# Patient Record
Sex: Male | Born: 1985 | Race: Black or African American | Hispanic: No | Marital: Single | Smoking: Current every day smoker
Health system: Southern US, Community
[De-identification: ages and names within clinical notes are randomized; demographics above are authoritative.]

## PROBLEM LIST (undated history)

## (undated) DIAGNOSIS — A749 Chlamydial infection, unspecified: Secondary | ICD-10-CM

## (undated) DIAGNOSIS — A549 Gonococcal infection, unspecified: Secondary | ICD-10-CM

## (undated) HISTORY — PX: HERNIA REPAIR: SHX51

## (undated) HISTORY — DX: Gonococcal infection, unspecified: A54.9

## (undated) HISTORY — DX: Chlamydial infection, unspecified: A74.9

---

## 2004-02-02 ENCOUNTER — Emergency Department: Payer: Self-pay | Admitting: Emergency Medicine

## 2006-09-24 ENCOUNTER — Emergency Department: Payer: Self-pay | Admitting: Emergency Medicine

## 2008-03-30 IMAGING — CR DG FOOT COMPLETE 3+V*L*
1 series · 3 of 3 positions shown · non-contrast
Comparison: none

REASON FOR EXAM: dropped weight on great toe
COMMENTS:

PROCEDURE:     DXR - DXR FOOT LT COMP W/OBLIQUES  - September 24, 2006  [DATE]
RESULT:     Three views of the LEFT foot show no fracture, dislocation or
other acute bony abnormality.

[Series 1: view not recorded · 0.17mm/px · 3 of 3 slices shown]
[im 1/3]
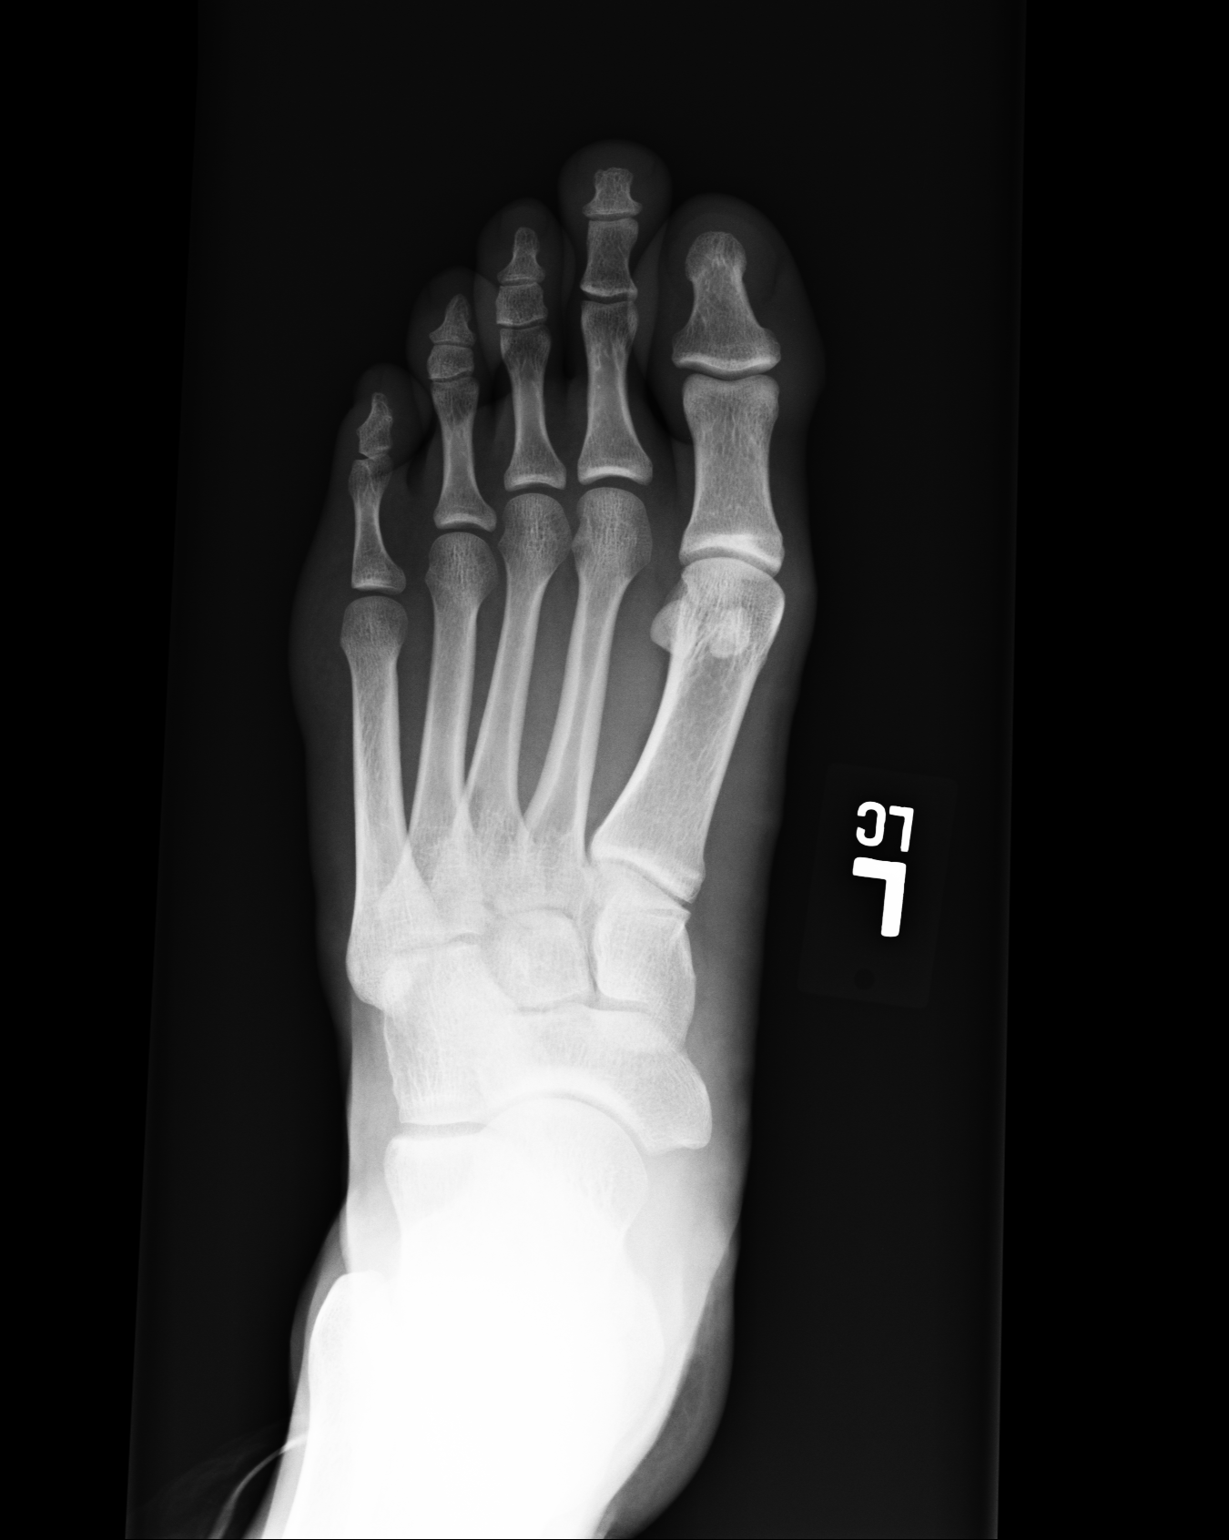
[im 2/3]
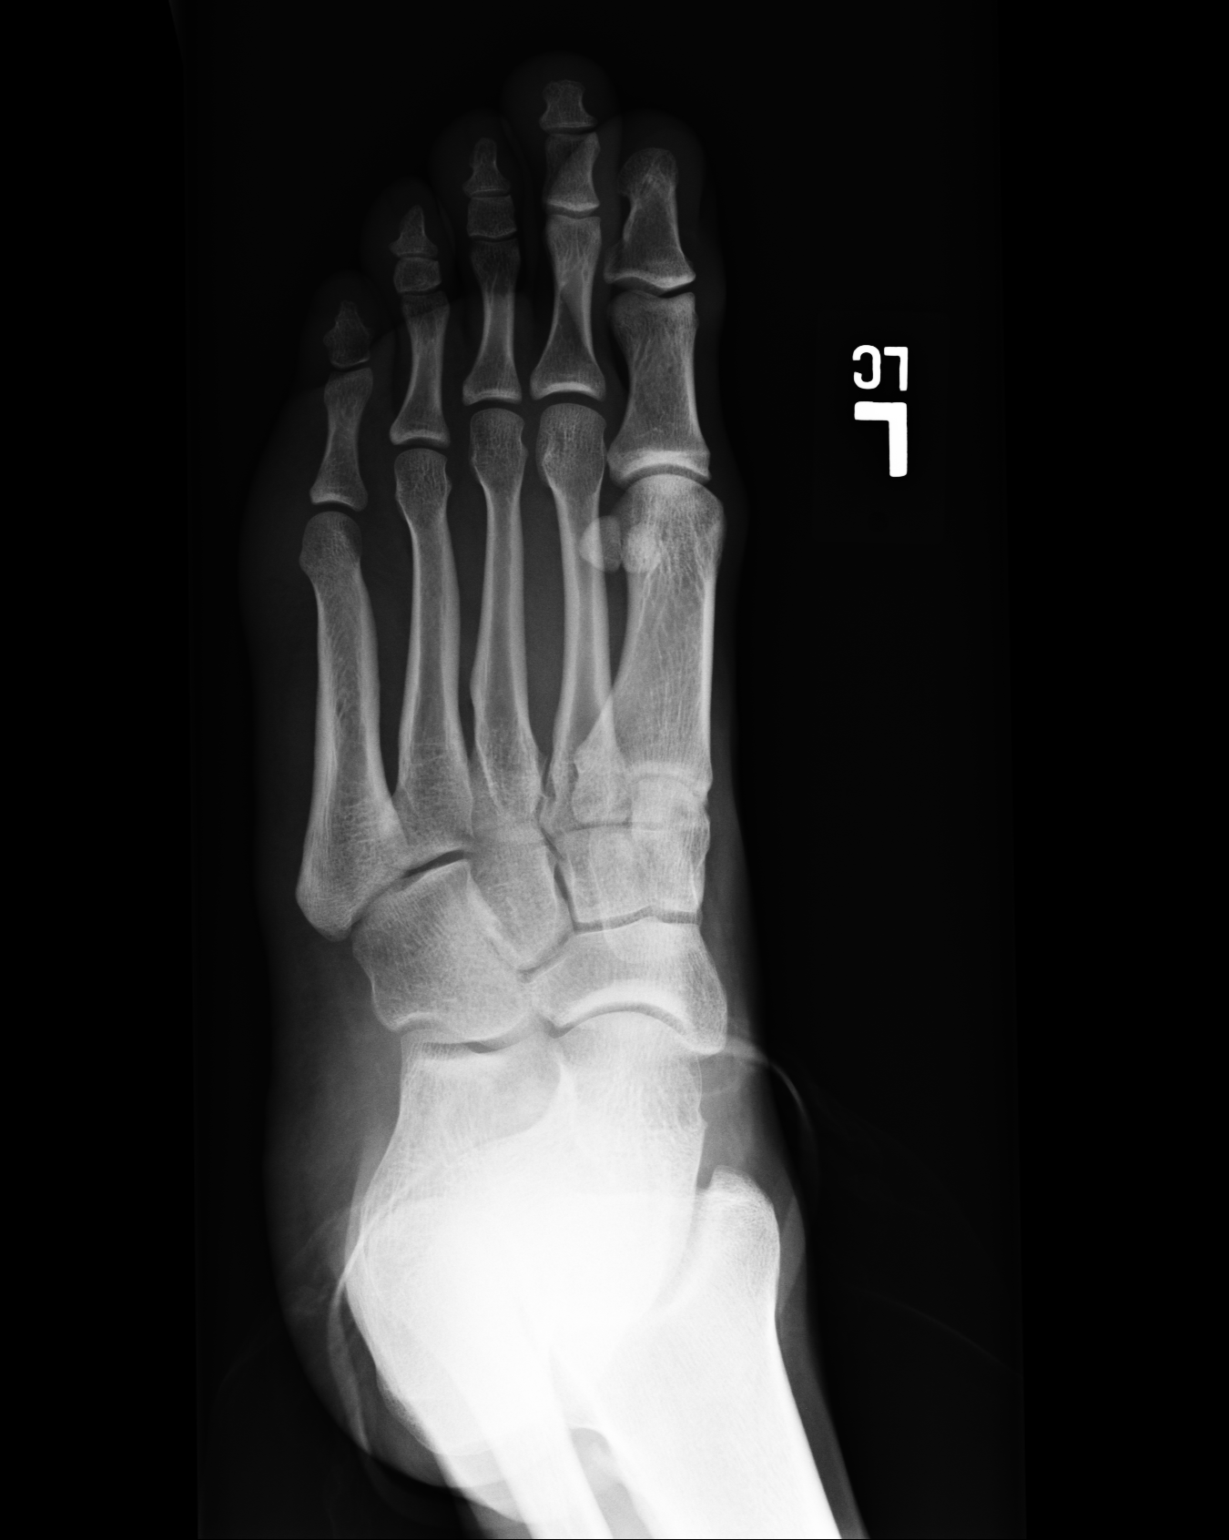
[im 3/3]
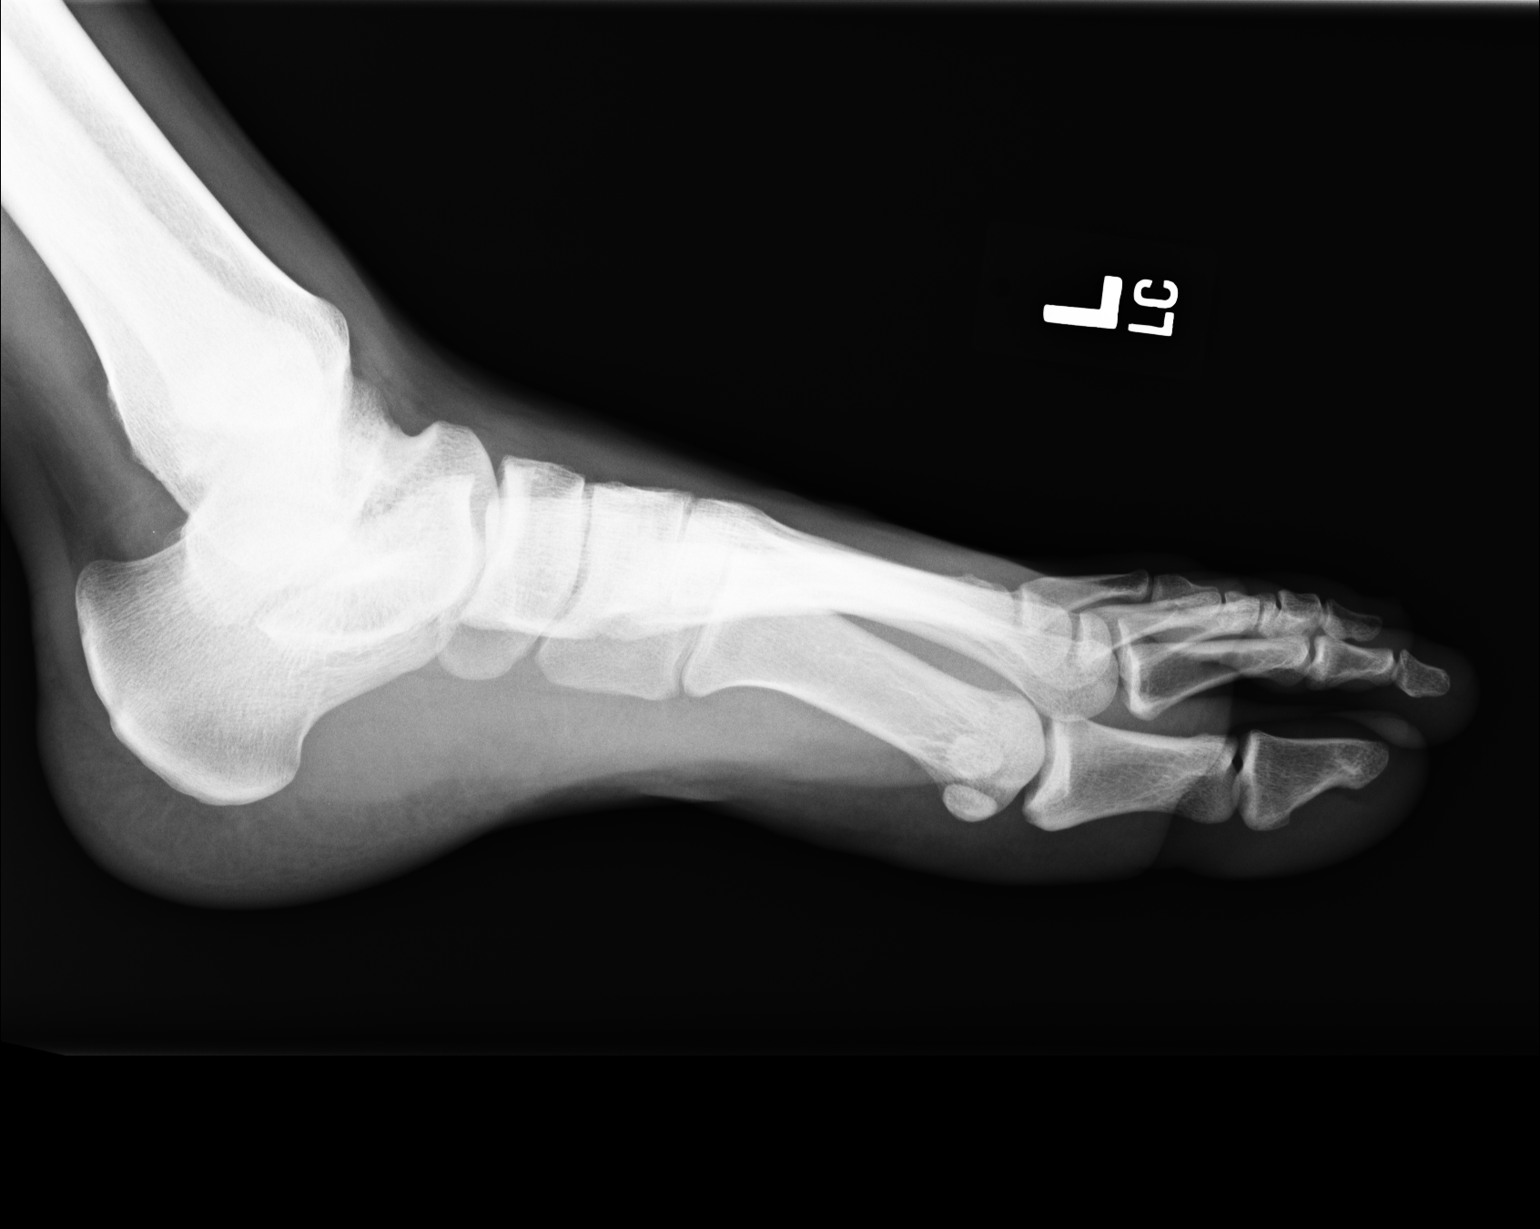

[3 of 3 positions shown; findings below may reference images not displayed]

IMPRESSION: 1)No acute changes are identified.

## 2009-05-18 ENCOUNTER — Emergency Department: Payer: Self-pay | Admitting: Emergency Medicine

## 2010-06-12 ENCOUNTER — Emergency Department: Payer: Self-pay | Admitting: Emergency Medicine

## 2011-05-31 ENCOUNTER — Emergency Department: Payer: Self-pay | Admitting: Emergency Medicine

## 2011-11-07 ENCOUNTER — Emergency Department: Payer: Self-pay | Admitting: *Deleted

## 2014-03-08 ENCOUNTER — Emergency Department: Payer: Self-pay | Admitting: Emergency Medicine

## 2014-03-08 LAB — GC/CHLAMYDIA PROBE AMP

## 2014-11-25 ENCOUNTER — Emergency Department
Admission: EM | Admit: 2014-11-25 | Discharge: 2014-11-25 | Disposition: A | Discharge status: Home / Self Care | Payer: Self-pay | Attending: Emergency Medicine | Admitting: Emergency Medicine

## 2014-11-25 ENCOUNTER — Encounter: Payer: Self-pay | Admitting: Emergency Medicine

## 2014-11-25 DIAGNOSIS — Z202 Contact with and (suspected) exposure to infections with a predominantly sexual mode of transmission: Secondary | ICD-10-CM | POA: Insufficient documentation

## 2014-11-25 DIAGNOSIS — Z720 Tobacco use: Secondary | ICD-10-CM | POA: Insufficient documentation

## 2014-11-25 LAB — CHLAMYDIA/NGC RT PCR (ARMC ONLY)
Chlamydia Tr: DETECTED — AB
N GONORRHOEAE: NOT DETECTED

## 2014-11-25 LAB — URINALYSIS COMPLETE WITH MICROSCOPIC (ARMC ONLY)
Bacteria, UA: NONE SEEN
Bilirubin Urine: NEGATIVE
GLUCOSE, UA: NEGATIVE mg/dL
Hgb urine dipstick: NEGATIVE
KETONES UR: NEGATIVE mg/dL
Nitrite: NEGATIVE
PROTEIN: NEGATIVE mg/dL
Specific Gravity, Urine: 1.017 (ref 1.005–1.030)
Squamous Epithelial / LPF: NONE SEEN
pH: 5 (ref 5.0–8.0)

## 2014-11-25 MED ORDER — AZITHROMYCIN 250 MG PO TABS
1000.0000 mg | ORAL_TABLET | Freq: Once | ORAL | Status: AC
  Administered 2014-11-25: 1000 mg via ORAL
  Filled 2014-11-25: qty 4

## 2014-11-25 NOTE — ED Notes (Signed)
States girlfrfiend was told she had chlamydia

## 2014-11-25 NOTE — ED Provider Notes (Signed)
[facility] Emergency Department Provider Note  ____________________________________________  Time seen: Approximately 4:32 PM  I have reviewed the triage vital signs and the nursing notes.   HISTORY  Chief Complaint Urinary Frequency    HPI Matthew Murillo is a 29 y.o. male patient stated positive contact with Chlamydia . Last sexual contact with one week ago. Patient stated except for some increased frequency no other signs and symptoms of sexual transmitted disease.. Patient also denies any penial lesions.   History reviewed. No pertinent past medical history.  There are no active problems to display for this patient.   Past Surgical History  Procedure Laterality Date  . Hernia repair      No current outpatient prescriptions on file.  Allergies Review of patient's allergies indicates no known allergies.  No family history on file.  Social History Social History  Substance Use Topics  . Smoking status: Current Some Day Smoker    Types: Cigarettes  . Smokeless tobacco: None  . Alcohol Use: Yes    Review of Systems Constitutional: No fever/chills Eyes: No visual changes. ENT: No sore throat. Cardiovascular: Denies chest pain. Respiratory: Denies shortness of breath. Gastrointestinal: No abdominal pain.  No nausea, no vomiting.  No diarrhea.  No constipation. Genitourinary: Negative for dysuria. Increased frequency. Musculoskeletal: Negative for back pain. Skin: Negative for rash. Neurological: Negative for headaches, focal weakness or numbness. Allergic/Immunilogical: **} 10-point ROS otherwise negative.  ____________________________________________   PHYSICAL EXAM:  VITAL SIGNS: ED Triage Vitals  Enc Vitals Group     BP 11/25/14 1625 119/80 mmHg     Pulse Rate 11/25/14 1625 65     Resp 11/25/14 1625 18     Temp 11/25/14 1625 98.1 F (36.7 C)     Temp Source 11/25/14 1625 Oral     SpO2 11/25/14 1625 97 %     Weight  11/25/14 1625 170 lb (77.111 kg)     Height 11/25/14 1625  (1.803 m)     Head Cir --      Peak Flow --      Pain Score --      Pain Loc --      Pain Edu? --      Excl. in GC? --     Constitutional: Alert and oriented. Well appearing and in no acute distress. Eyes: Conjunctivae are normal. PERRL. EOMI. Head: Atraumatic. Nose: No congestion/rhinnorhea. Mouth/Throat: Mucous membranes are moist.  Oropharynx non-erythematous. Neck: No stridor. No cervical spine tenderness to palpation. Hematological/Lymphatic/Immunilogical: No cervical lymphadenopathy. Cardiovascular: Normal rate, regular rhythm. Grossly normal heart sounds.  Good peripheral circulation. Respiratory: Normal respiratory effort.  No retractions. Lungs CTAB. Gastrointestinal: Soft and nontender. No distention. No abdominal bruits. No CVA tenderness. Genitourinary:  Musculoskeletal: No lower extremity tenderness nor edema.  No joint effusions. Neurologic:  Normal speech and language. No gross focal neurologic deficits are appreciated. No gait instability. Skin:  Skin is warm, dry and intact. No rash noted. Psychiatric: Mood and affect are normal. Speech and behavior are normal.  ____________________________________________   LABS (all labs ordered are listed, but only abnormal results are displayed)  Labs Reviewed  URINALYSIS COMPLETEWITH MICROSCOPIC ([facility] ONLY) - Abnormal; Notable for the following:    Color, Urine YELLOW (*)    APPearance CLEAR (*)    Leukocytes, UA 1+ (*)    All other components within normal limits  CHLAMYDIA/NGC RT PCR ([facility] ONLY)   ____________________________________________  EKG   ____________________________________________  RADIOLOGY   ____________________________________________  PROCEDURES  Procedure(s) performed: None  Critical Care performed: No  ____________________________________________   INITIAL IMPRESSION / ASSESSMENT AND PLAN / ED  COURSE  Pertinent labs & imaging results that were available during my care of the patient were reviewed by me and considered in my medical decision making (see chart for details).  Chlamydia exposure. Patient given 1 g of Zithromax and advised to follow with the [facility] department for further testing. ____________________________________________   FINAL CLINICAL IMPRESSION(S) / ED DIAGNOSES  Final diagnoses:  STD exposure      Joni Reining, PA-C 11/25/14 1729  Minna Antis, MD 11/26/14 [phone]

## 2014-11-27 ENCOUNTER — Telehealth: Payer: Self-pay | Admitting: Emergency Medicine

## 2014-11-27 NOTE — ED Notes (Signed)
Called patient to informof positive chlamydia test and need for partner treatment.  Patient was treated while in the ED.  No answer and no voicemail available.  Will send letter if he does not call back.

## 2022-05-19 ENCOUNTER — Ambulatory Visit: Payer: BC Managed Care – PPO | Admitting: Family

## 2022-05-20 ENCOUNTER — Ambulatory Visit: Payer: BC Managed Care – PPO | Admitting: Family

## 2022-05-20 ENCOUNTER — Encounter: Payer: Self-pay | Admitting: Family

## 2022-05-20 VITALS — BP 120/72 | HR 66 | Ht 71.0 in | Wt 172.2 lb

## 2022-05-20 DIAGNOSIS — E538 Deficiency of other specified B group vitamins: Secondary | ICD-10-CM | POA: Diagnosis not present

## 2022-05-20 DIAGNOSIS — I10 Essential (primary) hypertension: Secondary | ICD-10-CM | POA: Diagnosis not present

## 2022-05-20 DIAGNOSIS — R7303 Prediabetes: Secondary | ICD-10-CM

## 2022-05-20 DIAGNOSIS — F17200 Nicotine dependence, unspecified, uncomplicated: Secondary | ICD-10-CM | POA: Insufficient documentation

## 2022-05-20 DIAGNOSIS — R5383 Other fatigue: Secondary | ICD-10-CM

## 2022-05-20 DIAGNOSIS — E782 Mixed hyperlipidemia: Secondary | ICD-10-CM | POA: Diagnosis not present

## 2022-05-20 DIAGNOSIS — E559 Vitamin D deficiency, unspecified: Secondary | ICD-10-CM

## 2022-05-20 DIAGNOSIS — Z7689 Persons encountering health services in other specified circumstances: Secondary | ICD-10-CM

## 2022-05-20 NOTE — Progress Notes (Signed)
New Patient Office Visit  Subjective    Patient ID: Matthew Murillo, male    DOB: 11-27-1985  Age: 37 y.o. MRN: [id number]  CC:  Chief Complaint  Patient presents with   Establish Care    New Patient    HPI [name] presents to establish care  Patient here today to establish care.  He has not had a PCP in a long time, so he needs labwork and to have somewhere to go for illness when needed.   No other concerns today.     No outpatient encounter medications on file as of 05/20/2022.   No facility-administered encounter medications on file as of 05/20/2022.    History reviewed. No pertinent past medical history.  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 - [employer]    Comment: [employer]  Tobacco Use   Smoking status: Every Day    Packs/day: 0.50    Types: Cigarettes   Smokeless tobacco: Not on file  Substance and Sexual Activity   Alcohol use: Yes   Drug use: Not Currently   Sexual activity: Not on file  Other Topics Concern   Not on file  Social History Narrative   Not on file   Social Determinants of Health   Financial Resource Strain: Not on file  Food Insecurity: Not on file  Transportation Needs: Not on file  Physical Activity: Not on file  Stress: Not on file  Social Connections: Not on file  Intimate Partner Violence: Not on file    Review of Systems  All other systems reviewed and are negative.       Objective    BP 120/72   Pulse 66   Ht 5' 11"$  (1.803 m)   Wt 172 lb 3.2 oz (78.1 kg)   SpO2 99%   BMI 24.02 kg/m   Physical Exam Vitals and nursing note reviewed.   Constitutional:      Appearance: Normal appearance. He is normal weight.  Eyes:     Pupils: Pupils are equal, round, and reactive to light.  Cardiovascular:     Rate and Rhythm: Normal rate and regular rhythm.     Pulses: Normal pulses.     Heart sounds: Normal heart sounds.  Pulmonary:     Effort: Pulmonary effort is normal.     Breath sounds: Normal breath sounds.  Neurological:     Mental Status: He is alert.  Psychiatric:        Mood and Affect: Mood normal.        Behavior: Behavior normal.        Assessment & Plan:   Problem List Items Addressed This Visit     Tobacco use disorder   Other Visit Diagnoses     Mixed hyperlipidemia    -  Primary   Relevant Orders   Lipid panel   Vitamin D deficiency, unspecified       Relevant Orders   VITAMIN D 25 Hydroxy (Vit-D Deficiency, Fractures)   Essential hypertension, benign       Relevant Orders  CBC With Differential   CMP14+EGFR   B12 deficiency due to diet       Relevant Orders   Vitamin B12   Other fatigue       Relevant Orders   TSH   Prediabetes       Relevant Orders   Hemoglobin A1c   Encounter to establish care with new doctor           Return in about 2 weeks (around 06/03/2022).   Total time spent: 30 minutes  Mechele Claude, FNP  05/20/2022

## 2022-05-21 LAB — CMP14+EGFR
ALT: 30 IU/L (ref 0–44)
AST: 24 IU/L (ref 0–40)
Albumin/Globulin Ratio: 2.6 — ABNORMAL HIGH (ref 1.2–2.2)
Albumin: 4.6 g/dL (ref 4.1–5.1)
Alkaline Phosphatase: 76 IU/L (ref 44–121)
BUN/Creatinine Ratio: 17 (ref 9–20)
BUN: 16 mg/dL (ref 6–20)
Bilirubin Total: 0.3 mg/dL (ref 0.0–1.2)
CO2: 21 mmol/L (ref 20–29)
Calcium: 9.3 mg/dL (ref 8.7–10.2)
Chloride: 106 mmol/L (ref 96–106)
Creatinine, Ser: 0.95 mg/dL (ref 0.76–1.27)
Globulin, Total: 1.8 g/dL (ref 1.5–4.5)
Glucose: 87 mg/dL (ref 70–99)
Potassium: 4.5 mmol/L (ref 3.5–5.2)
Sodium: 141 mmol/L (ref 134–144)
Total Protein: 6.4 g/dL (ref 6.0–8.5)
eGFR: 105 mL/min/{1.73_m2} (ref >59)

## 2022-05-21 LAB — CBC WITH DIFFERENTIAL
Basophils Absolute: 0 10*3/uL (ref 0.0–0.2)
Basos: 0 %
EOS (ABSOLUTE): 0 10*3/uL (ref 0.0–0.4)
Eos: 1 %
Hematocrit: 37.5 % (ref 37.5–51.0)
Hemoglobin: 11.6 g/dL — ABNORMAL LOW (ref 13.0–17.7)
Immature Grans (Abs): 0 10*3/uL (ref 0.0–0.1)
Immature Granulocytes: 0 %
Lymphocytes Absolute: 1.8 10*3/uL (ref 0.7–3.1)
Lymphs: 35 %
MCH: 23.4 pg — ABNORMAL LOW (ref 26.6–33.0)
MCHC: 30.9 g/dL — ABNORMAL LOW (ref 31.5–35.7)
MCV: 76 fL — ABNORMAL LOW (ref 79–97)
Monocytes Absolute: 0.3 10*3/uL (ref 0.1–0.9)
Monocytes: 5 %
Neutrophils Absolute: 3 10*3/uL (ref 1.4–7.0)
Neutrophils: 59 %
RBC: 4.95 x10E6/uL (ref 4.14–5.80)
RDW: 12.6 % (ref 11.6–15.4)
WBC: 5 10*3/uL (ref 3.4–10.8)

## 2022-05-21 LAB — HEMOGLOBIN A1C
Est. average glucose Bld gHb Est-mCnc: 103 mg/dL
Hgb A1c MFr Bld: 5.2 % (ref 4.8–5.6)

## 2022-05-21 LAB — VITAMIN B12: Vitamin B-12: 407 pg/mL (ref 232–1245)

## 2022-05-21 LAB — TSH: TSH: 1.07 u[IU]/mL (ref 0.450–4.500)

## 2022-05-21 LAB — VITAMIN D 25 HYDROXY (VIT D DEFICIENCY, FRACTURES): Vit D, 25-Hydroxy: 8.4 ng/mL — ABNORMAL LOW (ref 30.0–100.0)

## 2022-05-21 LAB — LIPID PANEL
Chol/HDL Ratio: 2.7 ratio (ref 0.0–5.0)
Cholesterol, Total: 122 mg/dL (ref 100–199)
HDL: 45 mg/dL (ref >39)
LDL Chol Calc (NIH): 64 mg/dL (ref 0–99)
Triglycerides: 61 mg/dL (ref 0–149)
VLDL Cholesterol Cal: 13 mg/dL (ref 5–40)

## 2022-05-24 LAB — IRON AND TIBC
Iron Saturation: 19 % (ref 15–55)
Iron: 53 ug/dL (ref 38–169)
Total Iron Binding Capacity: 273 ug/dL (ref 250–450)
UIBC: 220 ug/dL (ref 111–343)

## 2022-05-24 LAB — FERRITIN: Ferritin: 135 ng/mL (ref 30–400)

## 2022-05-24 LAB — SPECIMEN STATUS REPORT

## 2022-05-29 ENCOUNTER — Encounter: Payer: Self-pay | Admitting: Family

## 2022-05-29 MED ORDER — VITAMIN D (ERGOCALCIFEROL) 1.25 MG (50000 UNIT) PO CAPS: 50000.0000 [IU] | ORAL_CAPSULE | ORAL | 3 refills | Status: DC

## 2022-05-29 NOTE — Addendum Note (Signed)
Addended by: Georgian Co on: 05/29/2022 09:40 AM   Modules accepted: Orders

## 2022-06-03 ENCOUNTER — Encounter: Payer: Self-pay | Admitting: Family

## 2022-06-03 ENCOUNTER — Ambulatory Visit: Payer: BC Managed Care – PPO | Admitting: Family

## 2022-06-03 VITALS — BP 124/62 | HR 79 | Ht 70.0 in | Wt 173.6 lb

## 2022-06-03 DIAGNOSIS — Z202 Contact with and (suspected) exposure to infections with a predominantly sexual mode of transmission: Secondary | ICD-10-CM | POA: Diagnosis not present

## 2022-06-03 DIAGNOSIS — F17200 Nicotine dependence, unspecified, uncomplicated: Secondary | ICD-10-CM | POA: Diagnosis not present

## 2022-06-03 DIAGNOSIS — E559 Vitamin D deficiency, unspecified: Secondary | ICD-10-CM

## 2022-06-03 NOTE — Patient Instructions (Addendum)
Start taking Vitamin D  Increase Iron Intake in diet  May want B12 OTC supplement.

## 2022-06-03 NOTE — Assessment & Plan Note (Signed)
Vitamin D supplement sent in for patient.  Will recheck at follow up appointment.

## 2022-06-03 NOTE — Progress Notes (Signed)
Established Patient Office Visit  Subjective:  Patient ID: Matthew Murillo, male    DOB: 1985/11/08  Age: 37 y.o. MRN: [id number]  Chief Complaint  Patient presents with   Follow-up    2 week follow up    Pt. Here today for 2 week n/p follow up.   Had labs done at that time, so we will review in detail today.   Labs: Vitamin D was extremely low, sent in Vitamin D supplement RX.  Also B12 was on lower end.   Asks if we can check STI labs today.  No other concerns today.       History reviewed. No pertinent past medical history.  Social History   Socioeconomic History   Marital status: Single    Spouse name: Not on file   Number of children: Not on file   Years of education: Not on file   Highest education level: Not on file  Occupational History   Occupation: Yard Switching - [place]    Comment: [employer]  Tobacco Use   Smoking status: Every Day    Packs/day: 0.50    Types: Cigarettes   Smokeless tobacco: Not on file  Substance and Sexual Activity   Alcohol use: Yes   Drug use: Not Currently   Sexual activity: Not on file  Other Topics Concern   Not on file  Social History Narrative   Not on file   Social Determinants of Health   Financial Resource Strain: Not on file  Food Insecurity: Not on file  Transportation Needs: Not on file  Physical Activity: Not on file  Stress: Not on file  Social Connections: Not on file  Intimate Partner Violence: Not on file    Family History  Problem Relation Age of Onset   Hypertension Mother     No Known Allergies  Review of Systems  Constitutional:  Positive for malaise/fatigue.  All other systems reviewed and are negative.      Objective:   BP 124/62   Pulse 79   Ht '5\' 10"'$  (1.778 m)   Wt 173 lb 9.6 oz (78.7 kg)   SpO2 96%   BMI 24.91 kg/m   Vitals:   06/03/22 0909  BP: 124/62  Pulse: 79  Height: '5\' 10"'$  (1.778 m)  Weight: 173 lb 9.6 oz (78.7 kg)  SpO2: 96%  BMI (Calculated): 24.91     Physical Exam Vitals and nursing note reviewed.  Constitutional:      Appearance: Normal appearance. He is normal weight.  Eyes:     Pupils: Pupils are equal, round, and reactive to light.  Cardiovascular:     Rate and Rhythm: Normal rate and regular rhythm.     Pulses: Normal pulses.     Heart sounds: Normal heart sounds.  Pulmonary:     Effort: Pulmonary effort is normal.     Breath sounds: Normal breath sounds.  Neurological:     Mental Status: He is alert.  Psychiatric:        Mood and Affect: Mood normal.        Behavior: Behavior normal.      No results found for any visits on 06/03/22.  Recent Results (from the past 2160 hour(s))  Lipid panel     Status: None   Collection Time: 05/20/22 11:02 AM  Result Value Ref Range   Cholesterol, Total 122 100 - 199 mg/dL   Triglycerides 61 0 - 149 mg/dL   HDL 45 >39 mg/dL   VLDL  Cholesterol Cal 13 5 - 40 mg/dL   LDL Chol Calc (NIH) 64 0 - 99 mg/dL   Chol/HDL Ratio 2.7 0.0 - 5.0 ratio    Comment:                                   T. Chol/HDL Ratio                                             Men  Women                               1/2 Avg.Risk  3.4    3.3                                   Avg.Risk  5.0    4.4                                2X Avg.Risk  9.6    7.1                                3X Avg.Risk 23.4   11.0   VITAMIN D 25 Hydroxy (Vit-D Deficiency, Fractures)     Status: Abnormal   Collection Time: 05/20/22 11:02 AM  Result Value Ref Range   Vit D, 25-Hydroxy 8.4 (L) 30.0 - 100.0 ng/mL    Comment: Vitamin D deficiency has been defined by the [place] practice guideline as a level of serum 25-OH vitamin D less than 20 ng/mL (1,2). The Endocrine Society went on to further define vitamin D insufficiency as a level between 21 and 29 ng/mL (2). 1. IOM (Institute of Medicine). 2010. Dietary reference    intakes for calcium and D. [place]: The    [employer]. 2. Holick MF, Binkley Briar, Bischoff-Ferrari HA, et al.    Evaluation, treatment, and prevention of vitamin D    deficiency: an Endocrine Society clinical practice    guideline. JCEM. 2011 Jul; 96(7):1911-30.   CBC With Differential     Status: Abnormal   Collection Time: 05/20/22 11:02 AM  Result Value Ref Range   WBC 5.0 3.4 - 10.8 x10E3/uL   RBC 4.95 4.14 - 5.80 x10E6/uL   Hemoglobin 11.6 (L) 13.0 - 17.7 g/dL   Hematocrit 37.5 37.5 - 51.0 %   MCV 76 (L) 79 - 97 fL   MCH 23.4 (L) 26.6 - 33.0 pg   MCHC 30.9 (L) 31.5 - 35.7 g/dL   RDW 12.6 11.6 - 15.4 %   Neutrophils 59 Not Estab. %   Lymphs 35 Not Estab. %   Monocytes 5 Not Estab. %   Eos 1 Not Estab. %   Basos 0 Not Estab. %   Neutrophils Absolute 3.0 1.4 - 7.0 x10E3/uL   Lymphocytes Absolute 1.8 0.7 - 3.1 x10E3/uL   Monocytes Absolute 0.3 0.1 - 0.9 x10E3/uL   EOS (ABSOLUTE) 0.0 0.0 - 0.4 x10E3/uL   Basophils Absolute 0.0 0.0 - 0.2 x10E3/uL   Immature Granulocytes 0  Not Estab. %   Immature Grans (Abs) 0.0 0.0 - 0.1 x10E3/uL  CMP14+EGFR     Status: Abnormal   Collection Time: 05/20/22 11:02 AM  Result Value Ref Range   Glucose 87 70 - 99 mg/dL   BUN 16 6 - 20 mg/dL   Creatinine, Ser 0.95 0.76 - 1.27 mg/dL   eGFR 105 >59 mL/min/1.73   BUN/Creatinine Ratio 17 9 - 20   Sodium 141 134 - 144 mmol/L   Potassium 4.5 3.5 - 5.2 mmol/L   Chloride 106 96 - 106 mmol/L   CO2 21 20 - 29 mmol/L   Calcium 9.3 8.7 - 10.2 mg/dL   Total Protein 6.4 6.0 - 8.5 g/dL   Albumin 4.6 4.1 - 5.1 g/dL   Globulin, Total 1.8 1.5 - 4.5 g/dL   Albumin/Globulin Ratio 2.6 (H) 1.2 - 2.2   Bilirubin Total 0.3 0.0 - 1.2 mg/dL   Alkaline Phosphatase 76 44 - 121 IU/L   AST 24 0 - 40 IU/L   ALT 30 0 - 44 IU/L  TSH     Status: None   Collection Time: 05/20/22 11:02 AM  Result Value Ref Range   TSH 1.070 0.450 - 4.500 uIU/mL  Hemoglobin A1c     Status: None   Collection Time: 05/20/22 11:02 AM  Result Value Ref Range   Hgb A1c MFr Bld 5.2 4.8 -  5.6 %    Comment:          Prediabetes: 5.7 - 6.4          Diabetes: >6.4          Glycemic control for adults with diabetes: <7.0    Est. average glucose Bld gHb Est-mCnc 103 mg/dL  Vitamin B12     Status: None   Collection Time: 05/20/22 11:02 AM  Result Value Ref Range   Vitamin B-12 407 232 - 1,245 pg/mL  Iron and TIBC     Status: None   Collection Time: 05/20/22 11:02 AM  Result Value Ref Range   Total Iron Binding Capacity 273 250 - 450 ug/dL   UIBC 220 111 - 343 ug/dL   Iron 53 38 - 169 ug/dL   Iron Saturation 19 15 - 55 %  Ferritin     Status: None   Collection Time: 05/20/22 11:02 AM  Result Value Ref Range   Ferritin 135 30 - 400 ng/mL  Specimen status report     Status: None   Collection Time: 05/20/22 11:02 AM  Result Value Ref Range   specimen status report Comment     Comment: Written Authorization Written Authorization Written Authorization Received. Authorization received from Georgian [employer]  for [employer] on 05-23-2022 Logged by [place]:   Problem List Items Addressed This Visit     Tobacco use disorder   Vitamin D deficiency, unspecified    Vitamin D supplement sent in for patient.  Will recheck at follow up appointment.       Other Visit Diagnoses     Contact with and (suspected) exposure to infections with a predominantly sexual mode of transmission    -  Primary   Relevant Orders   HSV 1 and 2 Ab, IgG   RPR w/reflex to TrepSure   Acute Viral Hepatitis (HAV, HBV, HCV)   HIV Antibody (routine testing w rflx)   Chlamydia/Gonococcus/Trichomonas, NAA      Patient Instructions  Start taking Vitamin D  Increase  Iron Intake in diet  May want B12 OTC supplement.    Return in about 4 months (around 10/03/2022).   Total time spent: 20 minutes  Mechele Claude, FNP  06/03/2022

## 2022-06-04 LAB — ACUTE VIRAL HEPATITIS (HAV, HBV, HCV)
HCV Ab: NONREACTIVE
Hep A IgM: NEGATIVE
Hep B C IgM: NEGATIVE
Hepatitis B Surface Ag: NEGATIVE

## 2022-06-04 LAB — HIV ANTIBODY (ROUTINE TESTING W REFLEX): HIV Screen 4th Generation wRfx: NONREACTIVE

## 2022-06-04 LAB — HSV 1 AND 2 AB, IGG
HSV 1 Glycoprotein G Ab, IgG: 49.1 index — ABNORMAL HIGH (ref 0.00–0.90)
HSV 2 IgG, Type Spec: <0.91 index (ref 0.00–0.90)

## 2022-06-04 LAB — RPR W/REFLEX TO TREPSURE: RPR: NONREACTIVE

## 2022-06-04 LAB — HCV INTERPRETATION

## 2022-06-04 LAB — T PALLIDUM ANTIBODY, EIA: T pallidum Antibody, EIA: NEGATIVE

## 2022-06-07 LAB — CHLAMYDIA/GONOCOCCUS/TRICHOMONAS, NAA
Chlamydia by NAA: NEGATIVE
Gonococcus by NAA: NEGATIVE
Trich vag by NAA: NEGATIVE

## 2022-06-09 ENCOUNTER — Encounter: Payer: Self-pay | Admitting: Family

## 2022-09-16 ENCOUNTER — Ambulatory Visit: Payer: BLUE CROSS/BLUE SHIELD | Admitting: Family

## 2022-09-16 ENCOUNTER — Other Ambulatory Visit: Payer: Self-pay | Admitting: Family

## 2022-09-16 ENCOUNTER — Encounter: Payer: Self-pay | Admitting: Family

## 2022-09-16 VITALS — BP 122/84 | HR 70 | Ht 71.0 in | Wt 163.4 lb

## 2022-09-16 DIAGNOSIS — Z202 Contact with and (suspected) exposure to infections with a predominantly sexual mode of transmission: Secondary | ICD-10-CM | POA: Diagnosis not present

## 2022-09-16 DIAGNOSIS — R8290 Unspecified abnormal findings in urine: Secondary | ICD-10-CM | POA: Diagnosis not present

## 2022-09-16 LAB — POCT URINALYSIS DIPSTICK
Glucose, UA: NEGATIVE
Nitrite, UA: NEGATIVE
Protein, UA: POSITIVE — AB
Spec Grav, UA: >=1.03 — AB (ref 1.010–1.025)
Urobilinogen, UA: >=8 E.U./dL — AB
pH, UA: 6.5 (ref 5.0–8.0)

## 2022-09-16 MED ORDER — AZITHROMYCIN 500 MG PO TABS: 2000.0000 mg | ORAL_TABLET | Freq: Once | ORAL | 0 refills | Status: DC

## 2022-09-16 MED ORDER — DOXYCYCLINE HYCLATE 100 MG PO CAPS: 100.0000 mg | ORAL_CAPSULE | Freq: Two times a day (BID) | ORAL | 0 refills | Status: DC

## 2022-09-16 MED ORDER — CEFIXIME 400 MG PO CAPS: 800.0000 mg | ORAL_CAPSULE | Freq: Once | ORAL | 0 refills | Status: AC

## 2022-09-16 NOTE — Progress Notes (Signed)
Acute Office Visit  Subjective:     Patient ID: Matthew Murillo, male    DOB: 1985-10-04, 37 y.o.   MRN: [id number]  Patient is in today for  Chief Complaint  Patient presents with   Follow-up    STI check    Exposure to STD The patient's primary symptoms include penile discharge and penile pain. Primary symptoms comment: dysuria. This is a new problem. The current episode started in the past 7 days. The problem occurs constantly. The problem has been gradually worsening. The pain is moderate. Associated symptoms include discolored urine, dysuria, frequency and urgency. The symptoms are aggravated by urination. He has tried nothing for the symptoms. He is sexually active. Yes, his partner has an STD. His past medical history is significant for chlamydia and gonorrhea.     Review of Systems  Genitourinary:  Positive for dysuria, frequency, penile discharge, penile pain and urgency.  All other systems reviewed and are negative.       Objective:    BP 122/84   Pulse 70   Ht 5\' 11"  (1.803 m)   Wt 163 lb 6.4 oz (74.1 kg)   SpO2 98%   BMI 22.79 kg/m   Physical Exam Vitals and nursing note reviewed.  Constitutional:      Appearance: Normal appearance. He is normal weight.  Eyes:     Extraocular Movements: Extraocular movements intact.     Conjunctiva/sclera: Conjunctivae normal.     Pupils: Pupils are equal, round, and reactive to light.  Cardiovascular:     Rate and Rhythm: Normal rate and regular rhythm.     Pulses: Normal pulses.  Pulmonary:     Effort: Pulmonary effort is normal.  Neurological:     Mental Status: He is alert.  Psychiatric:        Mood and Affect: Mood normal.        Behavior: Behavior normal.        Thought Content: Thought content normal.        Judgment: Judgment normal.     Results for orders placed or performed in visit on 09/16/22  POCT Urinalysis Dipstick (81002)  Result Value Ref Range   Color, UA yellow    Clarity, UA clear     Glucose, UA Negative Negative   Bilirubin, UA small    Ketones, UA trace    Spec Grav, UA >=1.030 (A) 1.010 - 1.025   Blood, UA trace    pH, UA 6.5 5.0 - 8.0   Protein, UA Positive (A) Negative   Urobilinogen, UA >=8.0 (A) 0.2 or 1.0 E.U./dL   Nitrite, UA neg    Leukocytes, UA Moderate (2+) (A) Negative   Appearance clear    Odor yes     Recent Results (from the past 2160 hour(s))  POCT Urinalysis Dipstick (40981)     Status: Abnormal   Collection Time: 09/16/22  3:16 PM  Result Value Ref Range   Color, UA yellow    Clarity, UA clear    Glucose, UA Negative Negative   Bilirubin, UA small    Ketones, UA trace    Spec Grav, UA >=1.030 (A) 1.010 - 1.025   Blood, UA trace    pH, UA 6.5 5.0 - 8.0   Protein, UA Positive (A) Negative   Urobilinogen, UA >=8.0 (A) 0.2 or 1.0 E.U./dL   Nitrite, UA neg    Leukocytes, UA Moderate (2+) (A) Negative   Appearance clear    Odor yes  Assessment & Plan:   Problem List Items Addressed This Visit   None Visit Diagnoses     Contact with and (suspected) exposure to infections with a predominantly sexual mode of transmission    -  Primary   Relevant Orders   Chlamydia/Gonococcus/Trichomonas, NAA   HSV 1 and 2 Ab, IgG   RPR w/reflex to TrepSure   Acute Viral Hepatitis (HAV, HBV, HCV)   HIV Antibody (routine testing w rflx)   Urinalysis, Routine w reflex microscopic   Urine Culture   POCT Urinalysis Dipstick (16109) (Completed)   Abnormal finding on urinalysis       Relevant Orders   Urinalysis, Routine w reflex microscopic   Urine Culture        Return as previously scheduled.  Total time spent: 20 minutes  Miki Kins, FNP  09/16/2022   This document may have been prepared by [facility] Voice Recognition software and as such may include unintentional dictation errors.

## 2022-09-17 LAB — URINALYSIS, ROUTINE W REFLEX MICROSCOPIC
Bilirubin, UA: NEGATIVE
Glucose, UA: NEGATIVE
Nitrite, UA: NEGATIVE
RBC, UA: NEGATIVE
Specific Gravity, UA: >=1.03 — AB (ref 1.005–1.030)
Urobilinogen, Ur: 2 mg/dL — ABNORMAL HIGH (ref 0.2–1.0)
pH, UA: 6.5 (ref 5.0–7.5)

## 2022-09-17 LAB — MICROSCOPIC EXAMINATION
Bacteria, UA: NONE SEEN
Casts: NONE SEEN /lpf
Epithelial Cells (non renal): NONE SEEN /hpf (ref 0–10)
WBC, UA: >30 /hpf — AB (ref 0–5)

## 2022-09-17 LAB — URINE CULTURE: Organism ID, Bacteria: NO GROWTH

## 2022-09-18 LAB — CHLAMYDIA/GONOCOCCUS/TRICHOMONAS, NAA
Chlamydia by NAA: NEGATIVE
Gonococcus by NAA: POSITIVE — AB
Trich vag by NAA: NEGATIVE

## 2022-09-19 LAB — HSV 1 AND 2 AB, IGG
HSV 1 Glycoprotein G Ab, IgG: 43.1 index — ABNORMAL HIGH (ref 0.00–0.90)
HSV 2 IgG, Type Spec: <0.91 index (ref 0.00–0.90)

## 2022-09-19 LAB — HCV INTERPRETATION

## 2022-09-19 LAB — TREPONEMAL ANTIBODIES, TPPA: Treponemal Antibodies, TPPA: NONREACTIVE

## 2022-09-19 LAB — ACUTE VIRAL HEPATITIS (HAV, HBV, HCV)
HCV Ab: NONREACTIVE
Hep A IgM: NEGATIVE
Hep B C IgM: NEGATIVE
Hepatitis B Surface Ag: NEGATIVE

## 2022-09-19 LAB — HIV ANTIBODY (ROUTINE TESTING W REFLEX): HIV Screen 4th Generation wRfx: NONREACTIVE

## 2022-09-19 LAB — RPR W/REFLEX TO TREPSURE: RPR: NONREACTIVE

## 2022-10-06 ENCOUNTER — Ambulatory Visit: Payer: BLUE CROSS/BLUE SHIELD | Admitting: Family

## 2022-10-10 ENCOUNTER — Ambulatory Visit: Payer: BLUE CROSS/BLUE SHIELD | Admitting: Family

## 2022-10-10 VITALS — BP 130/89 | HR 74 | Ht 71.0 in | Wt 164.2 lb

## 2022-10-10 DIAGNOSIS — I10 Essential (primary) hypertension: Secondary | ICD-10-CM

## 2022-10-10 DIAGNOSIS — E782 Mixed hyperlipidemia: Secondary | ICD-10-CM

## 2022-10-10 DIAGNOSIS — R8290 Unspecified abnormal findings in urine: Secondary | ICD-10-CM

## 2022-10-10 DIAGNOSIS — E538 Deficiency of other specified B group vitamins: Secondary | ICD-10-CM

## 2022-10-10 DIAGNOSIS — E559 Vitamin D deficiency, unspecified: Secondary | ICD-10-CM

## 2022-10-10 DIAGNOSIS — Z202 Contact with and (suspected) exposure to infections with a predominantly sexual mode of transmission: Secondary | ICD-10-CM | POA: Diagnosis not present

## 2022-10-10 DIAGNOSIS — R5383 Other fatigue: Secondary | ICD-10-CM | POA: Diagnosis not present

## 2022-10-11 ENCOUNTER — Encounter: Payer: Self-pay | Admitting: Family

## 2022-10-11 LAB — LIPID PANEL
Chol/HDL Ratio: 2.7 ratio (ref 0.0–5.0)
Cholesterol, Total: 145 mg/dL (ref 100–199)
HDL: 54 mg/dL (ref >39)
LDL Chol Calc (NIH): 76 mg/dL (ref 0–99)
Triglycerides: 74 mg/dL (ref 0–149)
VLDL Cholesterol Cal: 15 mg/dL (ref 5–40)

## 2022-10-11 LAB — CBC WITH DIFFERENTIAL
Basophils Absolute: 0 10*3/uL (ref 0.0–0.2)
Basos: 1 %
EOS (ABSOLUTE): 0.1 10*3/uL (ref 0.0–0.4)
Eos: 2 %
Hematocrit: 37.4 % — ABNORMAL LOW (ref 37.5–51.0)
Hemoglobin: 11.3 g/dL — ABNORMAL LOW (ref 13.0–17.7)
Immature Grans (Abs): 0 10*3/uL (ref 0.0–0.1)
Immature Granulocytes: 0 %
Lymphocytes Absolute: 1.4 10*3/uL (ref 0.7–3.1)
Lymphs: 33 %
MCH: 23.8 pg — ABNORMAL LOW (ref 26.6–33.0)
MCHC: 30.2 g/dL — ABNORMAL LOW (ref 31.5–35.7)
MCV: 79 fL (ref 79–97)
Monocytes Absolute: 0.2 10*3/uL (ref 0.1–0.9)
Monocytes: 5 %
Neutrophils Absolute: 2.5 10*3/uL (ref 1.4–7.0)
Neutrophils: 59 %
RBC: 4.75 x10E6/uL (ref 4.14–5.80)
RDW: 12.5 % (ref 11.6–15.4)
WBC: 4.2 10*3/uL (ref 3.4–10.8)

## 2022-10-11 LAB — CMP14+EGFR
ALT: 17 IU/L (ref 0–44)
AST: 25 IU/L (ref 0–40)
Albumin: 4.4 g/dL (ref 4.1–5.1)
Alkaline Phosphatase: 66 IU/L (ref 44–121)
BUN/Creatinine Ratio: 17 (ref 9–20)
BUN: 18 mg/dL (ref 6–20)
Bilirubin Total: 0.4 mg/dL (ref 0.0–1.2)
CO2: 26 mmol/L (ref 20–29)
Calcium: 9.5 mg/dL (ref 8.7–10.2)
Chloride: 104 mmol/L (ref 96–106)
Creatinine, Ser: 1.06 mg/dL (ref 0.76–1.27)
Globulin, Total: 1.9 g/dL (ref 1.5–4.5)
Glucose: 112 mg/dL — ABNORMAL HIGH (ref 70–99)
Potassium: 4.6 mmol/L (ref 3.5–5.2)
Sodium: 142 mmol/L (ref 134–144)
Total Protein: 6.3 g/dL (ref 6.0–8.5)
eGFR: 92 mL/min/{1.73_m2} (ref >59)

## 2022-10-11 LAB — VITAMIN D 25 HYDROXY (VIT D DEFICIENCY, FRACTURES): Vit D, 25-Hydroxy: 39.9 ng/mL (ref 30.0–100.0)

## 2022-10-11 LAB — VITAMIN B12: Vitamin B-12: 504 pg/mL (ref 232–1245)

## 2022-10-11 NOTE — Progress Notes (Signed)
Established Patient Office Visit  Subjective:  Patient ID: Matthew Murillo, male    DOB: 1985/10/25  Age: 37 y.o. MRN: [id number]  Chief Complaint  Patient presents with   Follow-up    3 mo    Patient is here today for his 3 months follow up.  He has been feeling well since last appointment.   He does have additional concerns to discuss today.  He would like to get retested to make sure that his previous infection has gone away.  Labs are due today. He needs refills.   I have reviewed his active problem list, medication list, allergies, notes from last encounter, lab results for his appointment today.      No other concerns at this time.   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[employer]    Comment: [employer]  Tobacco Use   Smoking status: Every Day    Current packs/day: 0.50    Types: Cigarettes   Smokeless tobacco: Not on file  Substance and Sexual Activity   Alcohol use: Yes   Drug use: Not Currently   Sexual activity: Yes  Other Topics Concern   Not on file  Social History Narrative   Not on file   Social Determinants of Health   Financial Resource Strain: Not on file  Food Insecurity: Not on file  Transportation Needs: Not on file  Physical Activity: Not on file  Stress: Not on file  Social Connections: Not on file  Intimate Partner Violence: Not on file    Family History  Problem Relation Age of Onset   Hypertension Mother     No Known Allergies  Review of Systems  All other systems reviewed and are negative.      Objective:   BP 130/89    Pulse 74   Ht 5\' 11"  (1.803 m)   Wt 164 lb 3.2 oz (74.5 kg)   SpO2 98%   BMI 22.90 kg/m   Vitals:   10/10/22 0939  BP: 130/89  Pulse: 74  Height: 5\' 11"  (1.803 m)  Weight: 164 lb 3.2 oz (74.5 kg)  SpO2: 98%  BMI (Calculated): 22.91    Physical Exam Vitals and nursing note reviewed.  Constitutional:      Appearance: Normal appearance. He is normal weight.  Eyes:     Pupils: Pupils are equal, round, and reactive to light.  Cardiovascular:     Rate and Rhythm: Normal rate and regular rhythm.     Pulses: Normal pulses.     Heart sounds: Normal heart sounds.  Pulmonary:     Effort: Pulmonary effort is normal.     Breath sounds: Normal breath sounds.  Neurological:     General: No focal deficit present.     Mental Status: He is alert and oriented to person, place, and time. Mental status is at baseline.  Psychiatric:        Mood and Affect: Mood normal.        Behavior: Behavior normal.  Thought Content: Thought content normal.        Judgment: Judgment normal.      Results for orders placed or performed in visit on 10/10/22  Lipid panel  Result Value Ref Range   Cholesterol, Total 145 100 - 199 mg/dL   Triglycerides 74 0 - 149 mg/dL   HDL 54 >16 mg/dL   VLDL Cholesterol Cal 15 5 - 40 mg/dL   LDL Chol Calc (NIH) 76 0 - 99 mg/dL   Chol/HDL Ratio 2.7 0.0 - 5.0 ratio  VITAMIN D 25 Hydroxy (Vit-D Deficiency, Fractures)  Result Value Ref Range   Vit D, 25-Hydroxy 39.9 30.0 - 100.0 ng/mL  CBC With Differential  Result Value Ref Range   WBC 4.2 3.4 - 10.8 x10E3/uL   RBC 4.75 4.14 - 5.80 x10E6/uL   Hemoglobin 11.3 (L) 13.0 - 17.7 g/dL   Hematocrit 10.9 (L) 60.4 - 51.0 %   MCV 79 79 - 97 fL   MCH 23.8 (L) 26.6 - 33.0 pg   MCHC 30.2 (L) 31.5 - 35.7 g/dL   RDW 54.0 98.1 - 19.1 %   Neutrophils 59 Not Estab. %   Lymphs 33 Not Estab. %   Monocytes 5 Not Estab. %   Eos 2 Not Estab. %   Basos 1 Not Estab. %   Neutrophils Absolute 2.5 1.4 - 7.0 x10E3/uL    Lymphocytes Absolute 1.4 0.7 - 3.1 x10E3/uL   Monocytes Absolute 0.2 0.1 - 0.9 x10E3/uL   EOS (ABSOLUTE) 0.1 0.0 - 0.4 x10E3/uL   Basophils Absolute 0.0 0.0 - 0.2 x10E3/uL   Immature Granulocytes 0 Not Estab. %   Immature Grans (Abs) 0.0 0.0 - 0.1 x10E3/uL  CMP14+EGFR  Result Value Ref Range   Glucose 112 (H) 70 - 99 mg/dL   BUN 18 6 - 20 mg/dL   Creatinine, Ser 4.78 0.76 - 1.27 mg/dL   eGFR 92 >29 FA/OZH/0.86   BUN/Creatinine Ratio 17 9 - 20   Sodium 142 134 - 144 mmol/L   Potassium 4.6 3.5 - 5.2 mmol/L   Chloride 104 96 - 106 mmol/L   CO2 26 20 - 29 mmol/L   Calcium 9.5 8.7 - 10.2 mg/dL   Total Protein 6.3 6.0 - 8.5 g/dL   Albumin 4.4 4.1 - 5.1 g/dL   Globulin, Total 1.9 1.5 - 4.5 g/dL   Bilirubin Total 0.4 0.0 - 1.2 mg/dL   Alkaline Phosphatase 66 44 - 121 IU/L   AST 25 0 - 40 IU/L   ALT 17 0 - 44 IU/L  Vitamin B12  Result Value Ref Range   Vitamin B-12 504 232 - 1,245 pg/mL    Recent Results (from the past 2160 hour(s))  POCT Urinalysis Dipstick (57846)     Status: Abnormal   Collection Time: 09/16/22  3:16 PM  Result Value Ref Range   Color, UA yellow    Clarity, UA clear    Glucose, UA Negative Negative   Bilirubin, UA small    Ketones, UA trace    Spec Grav, UA >=1.030 (A) 1.010 - 1.025   Blood, UA trace    pH, UA 6.5 5.0 - 8.0   Protein, UA Positive (A) Negative   Urobilinogen, UA >=8.0 (A) 0.2 or 1.0 E.U./dL   Nitrite, UA neg    Leukocytes, UA Moderate (2+) (A) Negative   Appearance clear    Odor yes   HSV 1 and 2 Ab, IgG     Status: Abnormal   Collection Time:  09/16/22  3:47 PM  Result Value Ref Range   HSV 1 Glycoprotein G Ab, IgG 43.10 (H) 0.00 - 0.90 index    Comment:                                  Negative        <0.91                                  Equivocal 0.91 - 1.09                                  Positive        >1.09  Note: Negative indicates no antibodies detected to  HSV-1. Equivocal may suggest early infection.  If  clinically  appropriate, retest at later date. Positive  indicates antibodies detected to HSV-1.    HSV 2 IgG, Type Spec <0.91 0.00 - 0.90 index    Comment:                                  Negative        <0.91                                  Equivocal 0.91 - 1.09                                  Positive        >1.09  HSV-2 Antibody Interpretation: Current guidelines and  recommendations do not recommend routine screening  for HSV-2 in asymptomatic individuals, including  those that are pregnant. A negative antibody result  indicates no detectable antibodies to HSV-2 were  found. If recent exposure is suspected, retest in 4  to 6 weeks. Equivocal samples should be retested in  4 to 6 weeks. A positive result indicates the  presence of detectable IgG antibody to HSV-2.  FALSE POSITIVE RESULTS MAY OCCUR. Repeat testing, or  testing by a different method, may be indicated in  some settings (e.g. patients with low likelihood of  HSV infection). If clinically appropriate, retest 4  to 6 weeks later. HSV-2 IgG antibody testing results  should be clinically correlated.   RPR w/reflex to TrepSure     Status: None   Collection Time: 09/16/22  3:47 PM  Result Value Ref Range   RPR Non Reactive Non Reactive  Acute Viral Hepatitis (HAV, HBV, HCV)     Status: None   Collection Time: 09/16/22  3:47 PM  Result Value Ref Range   Hep A IgM Negative Negative   Hepatitis B Surface Ag Negative Negative   Hep B C IgM Negative Negative   HCV Ab Non Reactive Non Reactive  HIV Antibody (routine testing w rflx)     Status: None   Collection Time: 09/16/22  3:47 PM  Result Value Ref Range   HIV Screen 4th Generation wRfx Non Reactive Non Reactive    Comment: HIV-1/HIV-2 antibodies and HIV-1 p24 antigen were NOT detected. There is no laboratory evidence of HIV infection. HIV Negative   Interpretation:  Status: None   Collection Time: 09/16/22  3:47 PM  Result Value Ref Range   HCV Interp 1: Comment      Comment: Not infected with HCV unless early or acute infection is suspected (which may be delayed in an immunocompromised individual), or other evidence exists to indicate HCV infection.   Treponemal Antibodies, TPPA     Status: None   Collection Time: 09/16/22  3:47 PM  Result Value Ref Range   Treponemal Antibodies, TPPA Non Reactive Non Reactive   Interpretation: Comment     Comment: Syphilis: Treponemal Antibodies with Reflex to RPR and RPR           Titer, Reverse Screening and Diagnosis Algorithm ------------------------------------------------------------ Treponemal              Treponemal     Ab       RPR, Qn     Ab, TPPA    Final Interpretation ----------   --------   ----------   ----------------------- Non          N/A        N/A          No laboratory evidence Reactive                             of syphilis. Retest in                                      2-4 weeks if recent                                      exposure is suspected. ----------   --------   ----------   ----------------------- Reactive     Non        Non          Treponemal antibodies              Reactive   Reactive     not confirmed.                                      Inconclusive for                                      syphilis; potential                                      early syphilis,                                      possible false                                       positive. Retest in 2-4  weeks if recent                                      exposure is suspected. ----------   --------   ----------   ----------------------- Reactive     Non        Reactive     Treponemal antibodies              Reactive                detected. Consistent                                      with past or current                                      (potential early)                                      syphilis. ----------   --------   ----------    ----------------------- Reactive     >/=1:1     N/A          Treponemal and                                      nontreponemal                                      antibodies detected.                                      Consistent with current                                      or past syphilis. This test is intended ONLY for specimens that have tested positive (reactive) or equivocal for Treponema pallidum antibodies prior to submission for testing.  For the full CDC-recommended syphilis screening and diagnosis algorithm, [facility] offers test code 012005 RPR, Rfx Qn RPR/Confirm TP or 161096 T pallidum Screening Cascade.   Urinalysis, Routine w reflex microscopic     Status: Abnormal   Collection Time: 09/16/22  3:49 PM  Result Value Ref Range   Specific Gravity, UA      >=1.030 (A) 1.005 - 1.030   pH, UA 6.5 5.0 - 7.5   Color, UA Yellow Yellow   Appearance Ur Cloudy (A) Clear   Leukocytes,UA 2+ (A) Negative   Protein,UA 1+ (A) Negative/Trace   Glucose, UA Negative Negative   Ketones, UA Trace (A) Negative   RBC, UA Negative Negative   Bilirubin, UA Negative Negative   Urobilinogen, Ur 2.0 (H) 0.2 - 1.0 mg/dL   Nitrite, UA Negative Negative   Microscopic Examination See below:     Comment: Microscopic was indicated and was performed.  Microscopic Examination  Status: Abnormal   Collection Time: 09/16/22  3:49 PM  Result Value Ref Range   WBC, UA >30 (A) 0 - 5 /hpf   RBC, Urine 3-10 (A) 0 - 2 /hpf   Epithelial Cells (non renal) None seen 0 - 10 /hpf   Casts None seen None seen /lpf   Crystals Present (A) N/A   Crystal Type Amorphous Sediment N/A   Bacteria, UA None seen None seen/Few  Urine Culture     Status: None   Collection Time: 09/16/22  3:51 PM   Specimen: Urine, Clean Catch   UC  Result Value Ref Range   Urine Culture, Routine Final report    Organism ID, Bacteria No growth   Chlamydia/Gonococcus/Trichomonas, NAA     Status: Abnormal   Collection Time:  09/16/22  3:53 PM   UC  Result Value Ref Range   Chlamydia by NAA Negative Negative   Gonococcus by NAA Positive (A) Negative   Trich vag by NAA Negative Negative  Lipid panel     Status: None   Collection Time: 10/10/22 10:23 AM  Result Value Ref Range   Cholesterol, Total 145 100 - 199 mg/dL   Triglycerides 74 0 - 149 mg/dL   HDL 54 >16 mg/dL   VLDL Cholesterol Cal 15 5 - 40 mg/dL   LDL Chol Calc (NIH) 76 0 - 99 mg/dL   Chol/HDL Ratio 2.7 0.0 - 5.0 ratio    Comment:                                   T. Chol/HDL Ratio                                             Men  Women                               1/2 Avg.Risk  3.4    3.3                                   Avg.Risk  5.0    4.4                                2X Avg.Risk  9.6    7.1                                3X Avg.Risk 23.4   11.0   VITAMIN D 25 Hydroxy (Vit-D Deficiency, Fractures)     Status: None   Collection Time: 10/10/22 10:23 AM  Result Value Ref Range   Vit D, 25-Hydroxy 39.9 30.0 - 100.0 ng/mL    Comment: Vitamin D deficiency has been defined by the Institute of Medicine and an Endocrine Society practice guideline as a level of serum 25-OH vitamin D less than 20 ng/mL (1,2). The Endocrine Society went on to further define vitamin D insufficiency as a level between 21 and 29 ng/mL (2). 1. IOM (Institute of Medicine). 2010. Dietary reference    intakes for calcium and D. [place]: The  [employer]. 2. Holick MF, Binkley , Bischoff-Ferrari HA, et al.    Evaluation, treatment, and prevention of vitamin D    deficiency: an Endocrine Society clinical practice    guideline. JCEM. 2011 Jul; 96(7):1911-30.   CBC With Differential     Status: Abnormal   Collection Time: 10/10/22 10:23 AM  Result Value Ref Range   WBC 4.2 3.4 - 10.8 x10E3/uL   RBC 4.75 4.14 - 5.80 x10E6/uL   Hemoglobin 11.3 (L) 13.0 - 17.7 g/dL   Hematocrit 16.1 (L) 09.6 - 51.0 %   MCV 79 79 - 97 fL   MCH 23.8 (L) 26.6 - 33.0  pg   MCHC 30.2 (L) 31.5 - 35.7 g/dL   RDW 04.5 40.9 - 81.1 %   Neutrophils 59 Not Estab. %   Lymphs 33 Not Estab. %   Monocytes 5 Not Estab. %   Eos 2 Not Estab. %   Basos 1 Not Estab. %   Neutrophils Absolute 2.5 1.4 - 7.0 x10E3/uL   Lymphocytes Absolute 1.4 0.7 - 3.1 x10E3/uL   Monocytes Absolute 0.2 0.1 - 0.9 x10E3/uL   EOS (ABSOLUTE) 0.1 0.0 - 0.4 x10E3/uL   Basophils Absolute 0.0 0.0 - 0.2 x10E3/uL   Immature Granulocytes 0 Not Estab. %   Immature Grans (Abs) 0.0 0.0 - 0.1 x10E3/uL    Comment: **Effective October 27, 2022, profile [id number] CBC/Differential**   (No Platelet) will be made non-orderable. [facility] Offers:   [id number] CBC With Differential/Platelet   CMP14+EGFR     Status: Abnormal   Collection Time: 10/10/22 10:23 AM  Result Value Ref Range   Glucose 112 (H) 70 - 99 mg/dL   BUN 18 6 - 20 mg/dL   Creatinine, Ser 9.56 0.76 - 1.27 mg/dL   eGFR 92 >21 HY/QMV/7.84   BUN/Creatinine Ratio 17 9 - 20   Sodium 142 134 - 144 mmol/L   Potassium 4.6 3.5 - 5.2 mmol/L   Chloride 104 96 - 106 mmol/L   CO2 26 20 - 29 mmol/L   Calcium 9.5 8.7 - 10.2 mg/dL   Total Protein 6.3 6.0 - 8.5 g/dL   Albumin 4.4 4.1 - 5.1 g/dL   Globulin, Total 1.9 1.5 - 4.5 g/dL   Bilirubin Total 0.4 0.0 - 1.2 mg/dL   Alkaline Phosphatase 66 44 - 121 IU/L   AST 25 0 - 40 IU/L   ALT 17 0 - 44 IU/L  Vitamin B12     Status: None   Collection Time: 10/10/22 10:23 AM  Result Value Ref Range   Vitamin B-12 504 232 - 1,245 pg/mL       Assessment & Plan:   Problem List Items Addressed This Visit       Active Problems   Vitamin D deficiency, unspecified - Primary    Checking labs today.  Will continue supplements as needed.       Relevant Orders   VITAMIN D 25 Hydroxy (Vit-D Deficiency, Fractures) (Completed)   CBC With Differential (Completed)   CMP14+EGFR (Completed)   Other Visit Diagnoses     Other fatigue       Relevant Orders   CBC With Differential (Completed)   CMP14+EGFR (Completed)    Contact with and (suspected) exposure to infections with a predominantly sexual mode of transmission       rechecking STI labs today.  Will contact pt. with results once available.   Relevant Orders   CBC With Differential (Completed)   CMP14+EGFR (Completed)  Chlamydia/Gonococcus/Trichomonas, NAA   Abnormal finding on urinalysis       Relevant Orders   CBC With Differential (Completed)   CMP14+EGFR (Completed)   Mixed hyperlipidemia       Checking labs today.  Continue current therapy for lipid control. Will modify as needed based on labwork results.   Relevant Orders   Lipid panel (Completed)   CBC With Differential (Completed)   CMP14+EGFR (Completed)   Essential hypertension, benign       Blood pressure well controlled with current medications.  Continue current therapy.  Will reassess at follow up.   Relevant Orders   CBC With Differential (Completed)   CMP14+EGFR (Completed)   B12 deficiency due to diet       Checking labs today.  Will continue supplements as needed.   Relevant Orders   CBC With Differential (Completed)   CMP14+EGFR (Completed)   Vitamin B12 (Completed)       Return in about 3 months (around 01/10/2023) for F/U.   Total time spent: 30 minutes  Miki Kins, FNP  10/10/2022   This document may have been prepared by [facility] Voice Recognition software and as such may include unintentional dictation errors.

## 2022-10-11 NOTE — Assessment & Plan Note (Signed)
Checking labs today.  Will continue supplements as needed.  

## 2022-10-13 LAB — CHLAMYDIA/GONOCOCCUS/TRICHOMONAS, NAA
Chlamydia by NAA: NEGATIVE
Gonococcus by NAA: NEGATIVE
Trich vag by NAA: NEGATIVE

## 2023-01-12 ENCOUNTER — Encounter: Payer: Self-pay | Admitting: Family

## 2023-01-12 ENCOUNTER — Ambulatory Visit: Payer: BLUE CROSS/BLUE SHIELD | Admitting: Family

## 2023-01-12 VITALS — BP 142/92 | Ht 71.0 in | Wt 164.8 lb

## 2023-01-12 DIAGNOSIS — E538 Deficiency of other specified B group vitamins: Secondary | ICD-10-CM | POA: Diagnosis not present

## 2023-01-12 DIAGNOSIS — I10 Essential (primary) hypertension: Secondary | ICD-10-CM | POA: Diagnosis not present

## 2023-01-12 DIAGNOSIS — E611 Iron deficiency: Secondary | ICD-10-CM

## 2023-01-12 DIAGNOSIS — R5383 Other fatigue: Secondary | ICD-10-CM

## 2023-01-12 DIAGNOSIS — Z202 Contact with and (suspected) exposure to infections with a predominantly sexual mode of transmission: Secondary | ICD-10-CM

## 2023-01-12 DIAGNOSIS — R7303 Prediabetes: Secondary | ICD-10-CM

## 2023-01-12 DIAGNOSIS — E559 Vitamin D deficiency, unspecified: Secondary | ICD-10-CM | POA: Diagnosis not present

## 2023-01-12 DIAGNOSIS — E782 Mixed hyperlipidemia: Secondary | ICD-10-CM

## 2023-01-12 NOTE — Assessment & Plan Note (Signed)
Checking labs today.  Will continue supplements as needed.  

## 2023-01-12 NOTE — Progress Notes (Signed)
Established Patient Office Visit  Subjective:  Patient ID: Matthew Murillo, male    DOB: [birth year]  Age: 37 y.o. MRN: [id number]  Chief Complaint  Patient presents with   Follow-up    3 month follow up    Patient is here today for his 3 months follow up.  He has been feeling fairly well since last appointment.   He does have additional concerns to discuss today.  He had an issue with his job so his insurance will be canceled going forward after this month.  Labs are due today - will also get checked for his  He needs refills.   I have reviewed his active problem list, medication list, allergies, notes from last encounter, lab results for his appointment today.      No other concerns at this time.   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[employer]    Comment: [employer]  Tobacco Use   Smoking status: Every Day    Current packs/day: 0.50    Types: Cigarettes   Smokeless tobacco: Not on file  Substance and Sexual Activity   Alcohol use: Yes   Drug use: Not Currently   Sexual activity: Yes  Other Topics Concern   Not on file  Social History Narrative   Not on file   Social Determinants of Health   Financial Resource Strain: Not on file  Food Insecurity: Not on file  Transportation Needs: Not on file  Physical Activity: Not on file  Stress: Not on file  Social Connections: Not on file  Intimate Partner Violence: Not on file    Family History  Problem Relation Age of Onset   Hypertension Mother     No Known Allergies  Review of Systems  All other systems  reviewed and are negative.      Objective:   BP (!) 142/92   Ht 5\' 11"  (1.803 m)   Wt 164 lb 12.8 oz (74.8 kg)   BMI 22.98 kg/m   Vitals:   01/12/23 0854  BP: (!) 142/92  Height: 5\' 11"  (1.803 m)  Weight: 164 lb 12.8 oz (74.8 kg)  BMI (Calculated): 23    Physical Exam Vitals and nursing note reviewed.  Constitutional:      Appearance: Normal appearance. He is normal weight.  Eyes:     Extraocular Movements: Extraocular movements intact.     Conjunctiva/sclera: Conjunctivae normal.     Pupils: Pupils are equal, round, and reactive to light.  Cardiovascular:     Rate and Rhythm: Normal rate and regular rhythm.     Pulses: Normal pulses.     Heart sounds: Normal heart sounds.  Pulmonary:     Effort: Pulmonary effort is normal.     Breath sounds: Normal breath sounds.  Musculoskeletal:        General: Normal range of motion.  Neurological:     General: No focal deficit present.     Mental Status: He is alert and oriented to person, place,  and time. Mental status is at baseline.  Psychiatric:        Mood and Affect: Mood normal.        Behavior: Behavior normal.        Thought Content: Thought content normal.        Judgment: Judgment normal.      No results found for any visits on 01/12/23.  No results found for this or any previous visit (from the past 2160 hour(s)).     Assessment & Plan:   Problem List Items Addressed This Visit       Active Problems   Vitamin D deficiency, unspecified    Checking labs today.  Will continue supplements as needed.       Relevant Orders   VITAMIN D 25 Hydroxy (Vit-D Deficiency, Fractures)   CMP14+EGFR   CBC with Diff   Other Visit Diagnoses     Iron deficiency    -  Primary   Checking labs today.  Will continue supplements as needed.   Relevant Orders   CMP14+EGFR   CBC with Diff   Iron, TIBC and Ferritin Panel   B12 deficiency due to diet       Checking labs today.  Will continue supplements as needed.    Relevant Orders   CMP14+EGFR   Vitamin B12   CBC with Diff   Essential hypertension, benign       Blood pressure well controlled with current medications.  Continue current therapy.  Will reassess at follow up.   Relevant Orders   CMP14+EGFR   CBC with Diff   Mixed hyperlipidemia       Checking labs today.  Continue current therapy for lipid control. Will modify as needed based on labwork results.   Relevant Orders   Lipid panel   CMP14+EGFR   CBC with Diff   Prediabetes       Patient counseled on dietary choices and verbalized understanding. Will reassess at follow up after next lab check.   Relevant Orders   CMP14+EGFR   Hemoglobin A1c   CBC with Diff   Other fatigue       Checking labs today.  Will continue supplements as needed.   Relevant Orders   CMP14+EGFR   TSH   CBC with Diff   Contact with and (suspected) exposure to infections with a predominantly sexual mode of transmission       Rechecking labs today.  Will call with results of STI testing.   Relevant Orders   CMP14+EGFR   CBC with Diff   HSV 1 and 2 Ab, IgG   RPR w/reflex to TrepSure   Acute Viral Hepatitis (HAV, HBV, HCV)   HIV Antibody (routine testing w rflx)   Chlamydia/Gonococcus/Trichomonas, NAA       Return in about 3 months (around 04/14/2023).   Total time spent: 20 minutes  Miki Kins, FNP  01/12/2023   This document may have been prepared by [facility] Voice Recognition software and as such may include unintentional dictation errors.

## 2023-01-13 LAB — CMP14+EGFR
ALT: 22 [IU]/L (ref 0–44)
AST: 25 [IU]/L (ref 0–40)
Albumin: 4.5 g/dL (ref 4.1–5.1)
Alkaline Phosphatase: 73 [IU]/L (ref 44–121)
BUN/Creatinine Ratio: 16 (ref 9–20)
BUN: 18 mg/dL (ref 6–20)
Bilirubin Total: 0.6 mg/dL (ref 0.0–1.2)
CO2: 25 mmol/L (ref 20–29)
Calcium: 9.8 mg/dL (ref 8.7–10.2)
Chloride: 102 mmol/L (ref 96–106)
Creatinine, Ser: 1.15 mg/dL (ref 0.76–1.27)
Globulin, Total: 2.1 g/dL (ref 1.5–4.5)
Glucose: 98 mg/dL (ref 70–99)
Potassium: 4.9 mmol/L (ref 3.5–5.2)
Sodium: 142 mmol/L (ref 134–144)
Total Protein: 6.6 g/dL (ref 6.0–8.5)
eGFR: 83 mL/min/{1.73_m2} (ref >59)

## 2023-01-13 LAB — IRON,TIBC AND FERRITIN PANEL
Ferritin: 261 ng/mL (ref 30–400)
Iron Saturation: 33 % (ref 15–55)
Iron: 94 ug/dL (ref 38–169)
Total Iron Binding Capacity: 288 ug/dL (ref 250–450)
UIBC: 194 ug/dL (ref 111–343)

## 2023-01-13 LAB — LIPID PANEL
Chol/HDL Ratio: 2.2 {ratio} (ref 0.0–5.0)
Cholesterol, Total: 144 mg/dL (ref 100–199)
HDL: 65 mg/dL (ref >39)
LDL Chol Calc (NIH): 63 mg/dL (ref 0–99)
Triglycerides: 82 mg/dL (ref 0–149)
VLDL Cholesterol Cal: 16 mg/dL (ref 5–40)

## 2023-01-13 LAB — CBC WITH DIFFERENTIAL/PLATELET
Basophils Absolute: 0 10*3/uL (ref 0.0–0.2)
Basos: 1 %
EOS (ABSOLUTE): 0 10*3/uL (ref 0.0–0.4)
Eos: 1 %
Hematocrit: 42.1 % (ref 37.5–51.0)
Hemoglobin: 12.3 g/dL — ABNORMAL LOW (ref 13.0–17.7)
Immature Grans (Abs): 0 10*3/uL (ref 0.0–0.1)
Immature Granulocytes: 0 %
Lymphocytes Absolute: 1.2 10*3/uL (ref 0.7–3.1)
Lymphs: 30 %
MCH: 24 pg — ABNORMAL LOW (ref 26.6–33.0)
MCHC: 29.2 g/dL — ABNORMAL LOW (ref 31.5–35.7)
MCV: 82 fL (ref 79–97)
Monocytes Absolute: 0.3 10*3/uL (ref 0.1–0.9)
Monocytes: 7 %
Neutrophils Absolute: 2.5 10*3/uL (ref 1.4–7.0)
Neutrophils: 61 %
Platelets: 186 10*3/uL (ref 150–450)
RBC: 5.13 x10E6/uL (ref 4.14–5.80)
RDW: 13 % (ref 11.6–15.4)
WBC: 4 10*3/uL (ref 3.4–10.8)

## 2023-01-13 LAB — HIV ANTIBODY (ROUTINE TESTING W REFLEX): HIV Screen 4th Generation wRfx: NONREACTIVE

## 2023-01-13 LAB — RPR W/REFLEX TO TREPSURE: RPR: NONREACTIVE

## 2023-01-13 LAB — HEMOGLOBIN A1C
Est. average glucose Bld gHb Est-mCnc: 103 mg/dL
Hgb A1c MFr Bld: 5.2 % (ref 4.8–5.6)

## 2023-01-13 LAB — ACUTE VIRAL HEPATITIS (HAV, HBV, HCV)
HCV Ab: NONREACTIVE
Hep A IgM: NEGATIVE
Hep B C IgM: NEGATIVE
Hepatitis B Surface Ag: NEGATIVE

## 2023-01-13 LAB — HSV 1 AND 2 AB, IGG
HSV 1 Glycoprotein G Ab, IgG: REACTIVE — AB
HSV 2 IgG, Type Spec: NONREACTIVE

## 2023-01-13 LAB — TREPONEMAL ANTIBODIES, TPPA: Treponemal Antibodies, TPPA: NONREACTIVE

## 2023-01-13 LAB — VITAMIN B12: Vitamin B-12: 474 pg/mL (ref 232–1245)

## 2023-01-13 LAB — VITAMIN D 25 HYDROXY (VIT D DEFICIENCY, FRACTURES): Vit D, 25-Hydroxy: 50.5 ng/mL (ref 30.0–100.0)

## 2023-01-13 LAB — HCV INTERPRETATION

## 2023-01-13 LAB — TSH: TSH: 0.615 u[IU]/mL (ref 0.450–4.500)

## 2023-01-14 LAB — CHLAMYDIA/GONOCOCCUS/TRICHOMONAS, NAA
Chlamydia by NAA: NEGATIVE
Gonococcus by NAA: NEGATIVE
Trich vag by NAA: NEGATIVE

## 2023-04-14 ENCOUNTER — Ambulatory Visit: Payer: Self-pay | Admitting: Family

## 2023-10-13 ENCOUNTER — Telehealth: Payer: Self-pay | Admitting: Family

## 2023-10-13 NOTE — Telephone Encounter (Signed)
 Left a voicemail concerning whether he was a new patient at the [facility] yet. We have not received an application. Was calling to confirm this, but voicemail was full.
